# Patient Record
Sex: Male | Born: 1965 | Race: Black or African American | Hispanic: No | Smoking: Current every day smoker
Health system: Southern US, Community
[De-identification: ages and names within clinical notes are randomized; demographics above are authoritative.]

---

## 1999-11-16 ENCOUNTER — Emergency Department (HOSPITAL_COMMUNITY): Admission: EM | Admit: 1999-11-16 | Discharge: 1999-11-16 | Payer: Self-pay | Admitting: Emergency Medicine

## 1999-11-17 ENCOUNTER — Emergency Department (HOSPITAL_COMMUNITY): Admission: EM | Admit: 1999-11-17 | Discharge: 1999-11-17 | Payer: Self-pay | Admitting: Emergency Medicine

## 1999-11-25 ENCOUNTER — Emergency Department (HOSPITAL_COMMUNITY): Admission: EM | Admit: 1999-11-25 | Discharge: 1999-11-25 | Payer: Self-pay | Admitting: Emergency Medicine

## 2001-04-14 ENCOUNTER — Emergency Department (HOSPITAL_COMMUNITY): Admission: EM | Admit: 2001-04-14 | Discharge: 2001-04-14 | Payer: Self-pay | Admitting: Emergency Medicine

## 2003-11-05 ENCOUNTER — Emergency Department (HOSPITAL_COMMUNITY): Admission: EM | Admit: 2003-11-05 | Discharge: 2003-11-05 | Payer: Self-pay | Admitting: Emergency Medicine

## 2004-04-05 ENCOUNTER — Emergency Department (HOSPITAL_COMMUNITY): Admission: EM | Admit: 2004-04-05 | Discharge: 2004-04-05 | Payer: Self-pay | Admitting: Family Medicine

## 2008-01-28 ENCOUNTER — Emergency Department (HOSPITAL_COMMUNITY): Admission: EM | Admit: 2008-01-28 | Discharge: 2008-01-28 | Payer: Self-pay | Admitting: Emergency Medicine

## 2008-01-31 ENCOUNTER — Emergency Department (HOSPITAL_COMMUNITY): Admission: EM | Admit: 2008-01-31 | Discharge: 2008-02-01 | Payer: Self-pay | Admitting: Nurse Practitioner

## 2009-08-12 ENCOUNTER — Emergency Department (HOSPITAL_COMMUNITY): Admission: EM | Admit: 2009-08-12 | Discharge: 2009-08-12 | Payer: Self-pay | Admitting: Emergency Medicine

## 2009-08-28 ENCOUNTER — Emergency Department (HOSPITAL_COMMUNITY): Admission: EM | Admit: 2009-08-28 | Discharge: 2009-08-28 | Payer: Self-pay | Admitting: Emergency Medicine

## 2010-07-26 LAB — URINALYSIS, ROUTINE W REFLEX MICROSCOPIC
Ketones, ur: NEGATIVE mg/dL
Nitrite: NEGATIVE
Protein, ur: NEGATIVE mg/dL

## 2011-02-06 LAB — GLUCOSE, CAPILLARY: Glucose-Capillary: 91

## 2011-02-06 LAB — GC/CHLAMYDIA PROBE AMP, GENITAL
Chlamydia, DNA Probe: NEGATIVE
GC Probe Amp, Genital: NEGATIVE

## 2011-02-06 LAB — RPR: RPR Ser Ql: NONREACTIVE

## 2014-11-28 ENCOUNTER — Encounter (HOSPITAL_COMMUNITY): Payer: Self-pay | Admitting: Nurse Practitioner

## 2014-11-28 ENCOUNTER — Emergency Department (HOSPITAL_COMMUNITY): Payer: No Typology Code available for payment source

## 2014-11-28 ENCOUNTER — Emergency Department (HOSPITAL_COMMUNITY)
Admission: EM | Admit: 2014-11-28 | Discharge: 2014-11-28 | Disposition: A | Discharge status: Home / Self Care | Payer: No Typology Code available for payment source | Attending: Emergency Medicine | Admitting: Emergency Medicine

## 2014-11-28 DIAGNOSIS — Z720 Tobacco use: Secondary | ICD-10-CM | POA: Insufficient documentation

## 2014-11-28 DIAGNOSIS — Y92410 Unspecified street and highway as the place of occurrence of the external cause: Secondary | ICD-10-CM | POA: Diagnosis not present

## 2014-11-28 DIAGNOSIS — S161XXA Strain of muscle, fascia and tendon at neck level, initial encounter: Secondary | ICD-10-CM | POA: Insufficient documentation

## 2014-11-28 DIAGNOSIS — Y998 Other external cause status: Secondary | ICD-10-CM | POA: Insufficient documentation

## 2014-11-28 DIAGNOSIS — Y9389 Activity, other specified: Secondary | ICD-10-CM | POA: Insufficient documentation

## 2014-11-28 DIAGNOSIS — S24109A Unspecified injury at unspecified level of thoracic spinal cord, initial encounter: Secondary | ICD-10-CM | POA: Insufficient documentation

## 2014-11-28 DIAGNOSIS — S199XXA Unspecified injury of neck, initial encounter: Secondary | ICD-10-CM | POA: Diagnosis present

## 2014-11-28 MED ORDER — CYCLOBENZAPRINE HCL 10 MG PO TABS
10.0000 mg | ORAL_TABLET | Freq: Once | ORAL | Status: AC
  Administered 2014-11-28: 10 mg via ORAL
  Filled 2014-11-28: qty 1

## 2014-11-28 MED ORDER — NAPROXEN 500 MG PO TABS: 500.0000 mg | ORAL_TABLET | Freq: Two times a day (BID) | ORAL | Status: AC

## 2014-11-28 MED ORDER — CYCLOBENZAPRINE HCL 10 MG PO TABS: 10.0000 mg | ORAL_TABLET | Freq: Two times a day (BID) | ORAL | Status: AC | PRN

## 2014-11-28 MED ORDER — IBUPROFEN 400 MG PO TABS
800.0000 mg | ORAL_TABLET | Freq: Once | ORAL | Status: AC
  Administered 2014-11-28: 800 mg via ORAL
  Filled 2014-11-28: qty 2
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
## 2014-11-28 NOTE — Discharge Instructions (Signed)
Do not take the muscle relaxant if driving as it will make you sleepy.  °
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
## 2015-08-12 ENCOUNTER — Encounter (HOSPITAL_COMMUNITY): Payer: Self-pay | Admitting: Emergency Medicine

## 2015-08-12 ENCOUNTER — Emergency Department (HOSPITAL_COMMUNITY)
Admission: EM | Admit: 2015-08-12 | Discharge: 2015-08-12 | Disposition: A | Discharge status: Home / Self Care | Payer: Commercial Managed Care - HMO | Attending: Emergency Medicine | Admitting: Emergency Medicine

## 2015-08-12 DIAGNOSIS — R590 Localized enlarged lymph nodes: Secondary | ICD-10-CM | POA: Insufficient documentation

## 2015-08-12 DIAGNOSIS — F17210 Nicotine dependence, cigarettes, uncomplicated: Secondary | ICD-10-CM | POA: Insufficient documentation

## 2015-08-12 DIAGNOSIS — Z202 Contact with and (suspected) exposure to infections with a predominantly sexual mode of transmission: Secondary | ICD-10-CM | POA: Insufficient documentation

## 2015-08-12 DIAGNOSIS — Z711 Person with feared health complaint in whom no diagnosis is made: Secondary | ICD-10-CM

## 2015-08-12 DIAGNOSIS — Z791 Long term (current) use of non-steroidal anti-inflammatories (NSAID): Secondary | ICD-10-CM | POA: Insufficient documentation

## 2015-08-12 LAB — RAPID HIV SCREEN (HIV 1/2 AB+AG)
HIV 1/2 ANTIBODIES: NONREACTIVE
HIV-1 P24 ANTIGEN - HIV24: NONREACTIVE

## 2015-08-12 MED ORDER — CEFTRIAXONE SODIUM 250 MG IJ SOLR
250.0000 mg | Freq: Once | INTRAMUSCULAR | Status: AC
  Administered 2015-08-12: 250 mg via INTRAMUSCULAR
  Filled 2015-08-12: qty 250

## 2015-08-12 MED ORDER — AZITHROMYCIN 250 MG PO TABS
1000.0000 mg | ORAL_TABLET | Freq: Once | ORAL | Status: AC
  Administered 2015-08-12: 1000 mg via ORAL
  Filled 2015-08-12: qty 4

## 2015-08-12 MED ORDER — LIDOCAINE HCL (PF) 1 % IJ SOLN
2.0000 mL | Freq: Once | INTRAMUSCULAR | Status: AC
  Administered 2015-08-12: 2 mL
  Filled 2015-08-12: qty 5

## 2015-08-12 NOTE — ED Provider Notes (Signed)
CSN: [id number]     Arrival date & time 08/12/15  1550 History  By signing my name below, I, Joel Hatfield, attest that this documentation has been prepared under the direction and in the presence of Fayrene HelperBowie Antwanette Wesche, PA-C. Electronically Signed: Ronney LionSuzanne Hatfield, ED Scribe. 08/12/2015. 4:04 PM.    No chief complaint on file.  The history is provided by the patient. No language interpreter was used.    HPI Comments: Gurney MaxinMaurice Hatfield is a 50 y.o. male who presents to the Emergency Department complaining of possible exposure to STD's. He states he had unprotected sexual intercourse last night and was advised to get checked out. However, he reports he is asymptomatic at this time. Patient notes a history of chlamydia a few years ago. He states he has had 1 sexual partner in the past 6 months. He states he has not used any protection. He denies rash, penile discharge, or dysuria. Patient has NKDA.  No past medical history on file. No past surgical history on file. No family history on file. Social History  Substance Use Topics  . Smoking status: Current Every Day Smoker    Types: Cigarettes  . Smokeless tobacco: Not on file  . Alcohol Use: No    Review of Systems  Genitourinary: Negative for dysuria and discharge.  Skin: Negative for rash.    Allergies  Review of patient's allergies indicates no known allergies.  Home Medications   Prior to Admission medications   Medication Sig Start Date End Date Taking? Authorizing Provider  cyclobenzaprine (FLEXERIL) 10 MG tablet Take 1 tablet (10 mg total) by mouth 2 (two) times daily as needed for muscle spasms. 11/28/14   Hope Orlene OchM Neese, NP  naproxen (NAPROSYN) 500 MG tablet Take 1 tablet (500 mg total) by mouth 2 (two) times daily. 11/28/14   Hope Orlene OchM Neese, NP   There were no vitals taken for this visit. Physical Exam  Constitutional: He is oriented to person, place, and time. He appears well-developed and well-nourished. No distress.  HENT:  Head: Normocephalic  and atraumatic.  Eyes: Conjunctivae and EOM are normal.  Neck: Neck supple. No tracheal deviation present.  Cardiovascular: Normal rate.   Pulmonary/Chest: Effort normal. No respiratory distress.  Abdominal: Soft. There is no tenderness.  Genitourinary:  Chaperone present during exam.  Some inguinal lymphadenopathy noted. Circumcised penis, free of lesion or rash. Testicles with normal lie, non-tender to palpation. No scrotal swelling or rash.   Musculoskeletal: Normal range of motion.  Neurological: He is alert and oriented to person, place, and time.  Skin: Skin is warm and dry.  Psychiatric: He has a normal mood and affect. His behavior is normal.  Nursing note and vitals reviewed.   ED Course  Procedures (including critical care time)  DIAGNOSTIC STUDIES: Oxygen Saturation is 100% on RA, normal by my interpretation.    COORDINATION OF CARE: 4:02 PM - Discussed treatment plan with pt at bedside which includes STD testing. Pt verbalized understanding and agreed to plan.    MDM   Final diagnoses:  Concern about STD in male without diagnosis   Pt arrives for asymptomatic STD check. Discussed safe sexual practices. Pt is advised to follow up for free testing at local health department in the future. Pt appears safe for discharge. Will give rocephin/zithromax abx as prophylaxis.  BP 116/81 mmHg  Pulse 71  Temp(Src) 98.1 F (36.7 C) (Oral)  Resp 16  SpO2 100%   I personally performed the services described in this documentation, which  was scribed in my presence. The recorded information has been reviewed and is accurate.       Fayrene Helper, PA-C 08/12/15 1607  Benjiman Core, MD 08/13/15 [phone]

## 2015-08-12 NOTE — Discharge Instructions (Signed)
Sexually Transmitted Disease °A sexually transmitted disease (STD) is a disease or infection that may be passed (transmitted) from person to person, usually during sexual activity. This may happen by way of saliva, semen, blood, vaginal mucus, or urine. Common STDs include: °· Gonorrhea. °· Chlamydia. °· Syphilis. °· HIV and AIDS. °· Genital herpes. °· Hepatitis B and C. °· Trichomonas. °· Human papillomavirus (HPV). °· Pubic lice. °· Scabies. °· Mites. °· Bacterial vaginosis. °WHAT ARE CAUSES OF STDs? °An STD may be caused by bacteria, a virus, or parasites. STDs are often transmitted during sexual activity if one person is infected. However, they may also be transmitted through nonsexual means. STDs may be transmitted after:  °· Sexual intercourse with an infected person. °· Sharing sex toys with an infected person. °· Sharing needles with an infected person or using unclean piercing or tattoo needles. °· Having intimate contact with the genitals, mouth, or rectal areas of an infected person. °· Exposure to infected fluids during birth. °WHAT ARE THE SIGNS AND SYMPTOMS OF STDs? °Different STDs have different symptoms. Some people may not have any symptoms. If symptoms are present, they may include: °· Painful or bloody urination. °· Pain in the pelvis, abdomen, vagina, anus, throat, or eyes. °· A skin rash, itching, or irritation. °· Growths, ulcerations, blisters, or sores in the genital and anal areas. °· Abnormal vaginal discharge with or without bad odor. °· Penile discharge in men. °· Fever. °· Pain or bleeding during sexual intercourse. °· Swollen glands in the groin area. °· Yellow skin and eyes (jaundice). This is seen with hepatitis. °· Swollen testicles. °· Infertility. °· Sores and blisters in the mouth. °HOW ARE STDs DIAGNOSED? °To make a diagnosis, your health care provider may: °· Take a medical history. °· Perform a physical exam. °· Take a sample of any discharge to examine. °· Swab the throat,  cervix, opening to the penis, rectum, or vagina for testing. °· Test a sample of your first morning urine. °· Perform blood tests. °· Perform a Pap test, if this applies. °· Perform a colposcopy. °· Perform a laparoscopy. °HOW ARE STDs TREATED? °Treatment depends on the STD. Some STDs may be treated but not cured. °· Chlamydia, gonorrhea, trichomonas, and syphilis can be cured with antibiotic medicine. °· Genital herpes, hepatitis, and HIV can be treated, but not cured, with prescribed medicines. The medicines lessen symptoms. °· Genital warts from HPV can be treated with medicine or by freezing, burning (electrocautery), or surgery. Warts may come back. °· HPV cannot be cured with medicine or surgery. However, abnormal areas may be removed from the cervix, vagina, or vulva. °· If your diagnosis is confirmed, your recent sexual partners need treatment. This is true even if they are symptom-free or have a negative culture or evaluation. They should not have sex until their health care providers say it is okay. °· Your health care provider may test you for infection again 3 months after treatment. °HOW CAN I REDUCE MY RISK OF GETTING AN STD? °Take these steps to reduce your risk of getting an STD: °· Use latex condoms, dental dams, and water-soluble lubricants during sexual activity. Do not use petroleum jelly or oils. °· Avoid having multiple sex partners. °· Do not have sex with someone who has other sex partners °· Do not have sex with anyone you do not know or who is at high risk for an STD. °· Avoid risky sex practices that can break your skin. °· Do not have sex   if you have open sores on your mouth or skin. °· Avoid drinking too much alcohol or taking illegal drugs. Alcohol and drugs can affect your judgment and put you in a vulnerable position. °· Avoid engaging in oral and anal sex acts. °· Get vaccinated for HPV and hepatitis. If you have not received these vaccines in the past, talk to your health care  provider about whether one or both might be right for you. °· If you are at risk of being infected with HIV, it is recommended that you take a prescription medicine daily to prevent HIV infection. This is called pre-exposure prophylaxis (PrEP). You are considered at risk if: °¨ You are a man who has sex with other men (MSM). °¨ You are a heterosexual man or woman and are sexually active with more than one partner. °¨ You take drugs by injection. °¨ You are sexually active with a partner who has HIV. °· Talk with your health care provider about whether you are at high risk of being infected with HIV. If you choose to begin PrEP, you should first be tested for HIV. You should then be tested every 3 months for as long as you are taking PrEP. °WHAT SHOULD I DO IF I THINK I HAVE AN STD? °· See your health care provider. °· Tell your sexual partner(s). They should be tested and treated for any STDs. °· Do not have sex until your health care provider says it is okay. °WHEN SHOULD I GET IMMEDIATE MEDICAL CARE? °Contact your health care provider right away if:  °· You have severe abdominal pain. °· You are a man and notice swelling or pain in your testicles. °· You are a woman and notice swelling or pain in your vagina. °  °This information is not intended to replace advice given to you by your health care provider. Make sure you discuss any questions you have with your health care provider. °  °Document Released: 07/15/2002 Document Revised: 05/15/2014 Document Reviewed: 11/12/2012 °Elsevier Interactive Patient Education ©2016 [employer] ° °

## 2015-08-12 NOTE — ED Notes (Signed)
Pt st's he is not having symptoms but was told that he needed to be treated for STD.  No complaints from pt at this time

## 2015-08-13 LAB — GC/CHLAMYDIA PROBE AMP (~~LOC~~) NOT AT ARMC
Chlamydia: NEGATIVE
NEISSERIA GONORRHEA: NEGATIVE

## 2015-08-13 LAB — RPR: RPR: NONREACTIVE
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
